# Patient Record
Sex: Male | Born: 1975 | Race: White | Hispanic: No | Marital: Single | Smoking: Current every day smoker
Health system: Southern US, Community
[De-identification: ages and names within clinical notes are randomized; demographics above are authoritative.]

## PROBLEM LIST (undated history)

## (undated) DIAGNOSIS — N200 Calculus of kidney: Secondary | ICD-10-CM

---

## 2020-03-23 ENCOUNTER — Emergency Department (HOSPITAL_BASED_OUTPATIENT_CLINIC_OR_DEPARTMENT_OTHER)
Admission: EM | Admit: 2020-03-23 | Discharge: 2020-03-23 | Disposition: A | Discharge status: Home / Self Care | Payer: Self-pay | Attending: Emergency Medicine | Admitting: Emergency Medicine

## 2020-03-23 ENCOUNTER — Other Ambulatory Visit: Payer: Self-pay

## 2020-03-23 ENCOUNTER — Emergency Department (HOSPITAL_BASED_OUTPATIENT_CLINIC_OR_DEPARTMENT_OTHER): Payer: Self-pay

## 2020-03-23 ENCOUNTER — Encounter (HOSPITAL_BASED_OUTPATIENT_CLINIC_OR_DEPARTMENT_OTHER): Payer: Self-pay

## 2020-03-23 DIAGNOSIS — N201 Calculus of ureter: Secondary | ICD-10-CM

## 2020-03-23 DIAGNOSIS — N132 Hydronephrosis with renal and ureteral calculous obstruction: Secondary | ICD-10-CM | POA: Insufficient documentation

## 2020-03-23 DIAGNOSIS — F17210 Nicotine dependence, cigarettes, uncomplicated: Secondary | ICD-10-CM | POA: Insufficient documentation

## 2020-03-23 DIAGNOSIS — N179 Acute kidney failure, unspecified: Secondary | ICD-10-CM | POA: Insufficient documentation

## 2020-03-23 DIAGNOSIS — R61 Generalized hyperhidrosis: Secondary | ICD-10-CM | POA: Insufficient documentation

## 2020-03-23 DIAGNOSIS — M549 Dorsalgia, unspecified: Secondary | ICD-10-CM | POA: Insufficient documentation

## 2020-03-23 LAB — URINALYSIS, MICROSCOPIC (REFLEX)

## 2020-03-23 LAB — CBC WITH DIFFERENTIAL/PLATELET
Abs Immature Granulocytes: 0.07 10*3/uL (ref 0.00–0.07)
Basophils Absolute: 0.1 10*3/uL (ref 0.0–0.1)
Basophils Relative: 0 %
Eosinophils Absolute: 0.2 10*3/uL (ref 0.0–0.5)
Eosinophils Relative: 2 %
HCT: 46.5 % (ref 39.0–52.0)
Hemoglobin: 15.4 g/dL (ref 13.0–17.0)
Immature Granulocytes: 1 %
Lymphocytes Relative: 22 %
Lymphs Abs: 2.9 10*3/uL (ref 0.7–4.0)
MCH: 30.1 pg (ref 26.0–34.0)
MCHC: 33.1 g/dL (ref 30.0–36.0)
MCV: 91 fL (ref 80.0–100.0)
Monocytes Absolute: 1 10*3/uL (ref 0.1–1.0)
Monocytes Relative: 7 %
Neutro Abs: 9.2 10*3/uL — ABNORMAL HIGH (ref 1.7–7.7)
Neutrophils Relative %: 68 %
Platelets: 194 10*3/uL (ref 150–400)
RBC: 5.11 MIL/uL (ref 4.22–5.81)
RDW: 12.2 % (ref 11.5–15.5)
WBC: 13.4 10*3/uL — ABNORMAL HIGH (ref 4.0–10.5)
nRBC: 0 % (ref 0.0–0.2)

## 2020-03-23 LAB — COMPREHENSIVE METABOLIC PANEL
ALT: 26 U/L (ref 0–44)
AST: 27 U/L (ref 15–41)
Albumin: 4.3 g/dL (ref 3.5–5.0)
Alkaline Phosphatase: 63 U/L (ref 38–126)
Anion gap: 9 (ref 5–15)
BUN: 17 mg/dL (ref 6–20)
CO2: 27 mmol/L (ref 22–32)
Calcium: 8.9 mg/dL (ref 8.9–10.3)
Chloride: 101 mmol/L (ref 98–111)
Creatinine, Ser: 1.44 mg/dL — ABNORMAL HIGH (ref 0.61–1.24)
GFR calc Af Amer: >60 mL/min (ref >60)
GFR calc non Af Amer: 59 mL/min — ABNORMAL LOW (ref >60)
Glucose, Bld: 98 mg/dL (ref 70–99)
Potassium: 3.9 mmol/L (ref 3.5–5.1)
Sodium: 137 mmol/L (ref 135–145)
Total Bilirubin: 0.6 mg/dL (ref 0.3–1.2)
Total Protein: 7.5 g/dL (ref 6.5–8.1)

## 2020-03-23 LAB — URINALYSIS, ROUTINE W REFLEX MICROSCOPIC
Bilirubin Urine: NEGATIVE
Glucose, UA: NEGATIVE mg/dL
Ketones, ur: NEGATIVE mg/dL
Nitrite: NEGATIVE
Protein, ur: 100 mg/dL — AB
Specific Gravity, Urine: 1.02 (ref 1.005–1.030)
pH: 7 (ref 5.0–8.0)

## 2020-03-23 MED ORDER — KETOROLAC TROMETHAMINE 30 MG/ML IJ SOLN
30.0000 mg | Freq: Once | INTRAMUSCULAR | Status: AC
  Administered 2020-03-23: 30 mg via INTRAVENOUS
  Filled 2020-03-23: qty 1

## 2020-03-23 MED ORDER — ONDANSETRON 4 MG PO TBDP: 4.0000 mg | ORAL_TABLET | Freq: Three times a day (TID) | ORAL | 0 refills | Status: DC | PRN

## 2020-03-23 MED ORDER — OXYCODONE-ACETAMINOPHEN 5-325 MG PO TABS: 1.0000 | ORAL_TABLET | Freq: Four times a day (QID) | ORAL | 0 refills | Status: DC | PRN

## 2020-03-23 MED ORDER — MORPHINE SULFATE (PF) 4 MG/ML IV SOLN
4.0000 mg | Freq: Once | INTRAVENOUS | Status: AC
  Administered 2020-03-23: 4 mg via INTRAVENOUS
  Filled 2020-03-23: qty 1

## 2020-03-23 MED ORDER — SODIUM CHLORIDE 0.9 % IV BOLUS
1000.0000 mL | Freq: Once | INTRAVENOUS | Status: AC
  Administered 2020-03-23: 1000 mL via INTRAVENOUS

## 2020-03-23 MED ORDER — TAMSULOSIN HCL 0.4 MG PO CAPS: 0.4000 mg | ORAL_CAPSULE | Freq: Every day | ORAL | 0 refills | Status: DC

## 2020-03-23 NOTE — ED Provider Notes (Signed)
[facility] EMERGENCY DEPARTMENT Provider Note   CSN: [id number] Arrival date & time: 03/23/20  6967     History Chief Complaint  Patient presents with  . Flank Pain    Steven Baxter is a 44 y.o. male.  He is here with a complaint of severe right flank pain that started last evening.  No trauma.  Associated with diaphoresis and nausea.  Some radiation to right lower quadrant.  Also having urinary frequency but no dysuria.  No hematuria.  No prior history of kidney stones.  Took some Aleve without any improvement.  Also took a first dose of a friend's antibiotic in case he had infection.  The history is provided by the patient.  Flank Pain This is a new problem. The current episode started yesterday. The problem occurs constantly. The problem has not changed since onset.Associated symptoms include abdominal pain. Pertinent negatives include no chest pain, no headaches and no shortness of breath. Nothing aggravates the symptoms. Nothing relieves the symptoms. He has tried nothing for the symptoms. The treatment provided no relief.       History reviewed. No pertinent past medical history.  There are no problems to display for this patient.   History reviewed. No pertinent surgical history.     No family history on file.  Social History   Tobacco Use  . Smoking status: Current Every Day Smoker    Packs/day: 1.00    Types: Cigarettes  . Smokeless tobacco: Never Used  Vaping Use  . Vaping Use: Never used  Substance Use Topics  . Alcohol use: Never  . Drug use: Never    Home Medications Prior to Admission medications   Not on File    Allergies    Patient has no known allergies.  Review of Systems   Review of Systems  Constitutional: Positive for diaphoresis.  HENT: Negative for sore throat.   Eyes: Negative for visual disturbance.  Respiratory: Negative for shortness of breath.   Cardiovascular: Negative for chest pain.  Gastrointestinal: Positive for  abdominal pain and nausea. Negative for vomiting.  Genitourinary: Positive for flank pain and frequency. Negative for dysuria and testicular pain.  Musculoskeletal: Positive for back pain.  Skin: Negative for rash.  Neurological: Negative for headaches.    Physical Exam Updated Vital Signs BP 118/80 (BP Location: Right Arm)   Pulse 74   Temp 97.8 F (36.6 C) (Oral)   Resp 16   Ht 5\' 9"  (1.753 m)   Wt 64 kg   SpO2 100%   BMI 20.84 kg/m   Physical Exam Vitals and nursing note reviewed.  Constitutional:      Appearance: Normal appearance. He is well-developed.  HENT:     Head: Normocephalic and atraumatic.  Eyes:     Conjunctiva/sclera: Conjunctivae normal.  Cardiovascular:     Rate and Rhythm: Normal rate and regular rhythm.     Heart sounds: No murmur heard.   Pulmonary:     Effort: Pulmonary effort is normal. No respiratory distress.     Breath sounds: Normal breath sounds.  Abdominal:     Palpations: Abdomen is soft.     Tenderness: There is no abdominal tenderness. There is right CVA tenderness. There is no guarding or rebound.  Musculoskeletal:        General: No deformity or signs of injury. Normal range of motion.     Cervical back: Neck supple.  Skin:    General: Skin is warm and dry.  Neurological:  General: No focal deficit present.     Mental Status: He is alert.     Gait: Gait normal.     ED Results / Procedures / Treatments   Labs (all labs ordered are listed, but only abnormal results are displayed) Labs Reviewed  URINALYSIS, ROUTINE W REFLEX MICROSCOPIC - Abnormal; Notable for the following components:      Result Value   Hgb urine dipstick MODERATE (*)    Protein, ur 100 (*)    Leukocytes,Ua TRACE (*)    All other components within normal limits  COMPREHENSIVE METABOLIC PANEL - Abnormal; Notable for the following components:   Creatinine, Ser 1.44 (*)    GFR calc non Af Amer 59 (*)    All other components within normal limits  CBC WITH  DIFFERENTIAL/PLATELET - Abnormal; Notable for the following components:   WBC 13.4 (*)    Neutro Abs 9.2 (*)    All other components within normal limits  URINALYSIS, MICROSCOPIC (REFLEX) - Abnormal; Notable for the following components:   Bacteria, UA FEW (*)    All other components within normal limits    EKG None  Radiology CT Renal Stone Study  Result Date: 03/23/2020 CLINICAL DATA:  Right flank pain EXAM: CT ABDOMEN AND PELVIS WITHOUT CONTRAST TECHNIQUE: Multidetector CT imaging of the abdomen and pelvis was performed following the standard protocol without oral or IV contrast. COMPARISON:  None. FINDINGS: Lower chest: There is a calcified granuloma in the right base. There is slight bibasilar atelectasis. Lung bases otherwise are clear. Hepatobiliary: No focal liver lesions are appreciable on this noncontrast enhanced study. The gallbladder wall is not appreciably thickened. There is no biliary duct dilatation. Pancreas: There is no pancreatic mass or inflammatory focus. Spleen: No splenic lesions are evident. Adrenals/Urinary Tract: Adrenals bilaterally appear normal. There is no evident renal mass on either side. There is moderate hydronephrosis on the right. There is no appreciable hydronephrosis on the left. There are multiple 1-2 mm calculi scattered throughout each kidney. On the right, there is a 2 x 2 mm calculus in the distal ureter immediately proximal to the right ureterovesical junction. There is a nearby phlebolith on the right which appear separate from the right ureter. There are several phleboliths on the left. Urinary bladder is midline. The thickness of the urinary bladder wall is within normal limits. Stomach/Bowel: There is fluid in multiple loops of small bowel without small bowel wall thickening. There is moderate stool in colon. There is no appreciable bowel obstruction. Note that stomach is filled with food material. The terminal ileum appears unremarkable. There is no  appreciable free air or portal venous air. Vascular/Lymphatic: No abdominal aortic aneurysm. No vascular lesions are evident on this noncontrast enhanced study. There is no appreciable adenopathy in the abdomen or pelvis. Reproductive: Prostate and seminal vesicles are normal in size and contour. No evident pelvic mass. Other: Appendix appears unremarkable. No abscess or ascites is appreciable in the abdomen or pelvis. Musculoskeletal: No blastic or lytic bone lesions. No intramuscular or abdominal wall lesions are evident. IMPRESSION: 1. 2 x 2 mm calculus distal right ureter near the ureterovesical junction. Moderate hydronephrosis noted on the right. 2.  Multiple 1-2 mm calculi scattered throughout each kidney. 3. Multiple loops of fluid-filled small bowel which potentially could indicate a degree of underlying enteritis. No bowel obstruction or bowel wall thickening. No abscess in the abdomen or pelvis. Appendix appears normal. Stomach filled with food material. Electronically Signed   By: Bretta Bang  III M.D.   On: 03/23/2020 12:06    Procedures Procedures (including critical care time)  Medications Ordered in ED Medications  ketorolac (TORADOL) 30 MG/ML injection 30 mg (has no administration in time range)  sodium chloride 0.9 % bolus 1,000 mL (has no administration in time range)  morphine 4 MG/ML injection 4 mg (has no administration in time range)    ED Course  I have reviewed the triage vital signs and the nursing notes.  Pertinent labs & imaging results that were available during my care of the patient were reviewed by me and considered in my medical decision making (see chart for details).    MDM Rules/Calculators/A&P                         This patient complains of right flank pain and urinary frequency this involves an extensive number of treatment Options and is a complaint that carries with it a high risk of complications and Morbidity. The differential includes kidney  stone, pyelonephritis, biliary colic, musculoskeletal  I ordered, reviewed and interpreted labs, which included CBC with elevated white count normal hemoglobin, normal chemistries other than elevated creatinine unknown baseline, urinalysis with 6-10 reds 0-5 whites no definite infection I ordered medication IV fluids pain medication and nausea medication I ordered imaging studies which included CT renal and I independently    visualized and interpreted imaging which showed 2 mm distal right ureteral stone Previous records obtained and reviewed in epic, no recent visits  After the interventions stated above, I reevaluated the patient and found patient's pain to be well controlled.  Discussed the natural course of kidney stones and he is comfortable with plan for outpatient management with clear return instructions given.   Final Clinical Impression(s) / ED Diagnoses Final diagnoses:  Ureterolithiasis  AKI (acute kidney injury) (HCC)    Rx / DC Orders ED Discharge Orders         Ordered    oxyCODONE-acetaminophen (PERCOCET/ROXICET) 5-325 MG tablet  Every 6 hours PRN        03/23/20 1256    ondansetron (ZOFRAN-ODT) 4 MG disintegrating tablet  Every 8 hours PRN        03/23/20 1256    tamsulosin (FLOMAX) 0.4 MG CAPS capsule  Daily        03/23/20 1256           Terrilee Files, MD 03/23/20 1824

## 2020-03-23 NOTE — ED Notes (Signed)
ED Provider at bedside. 

## 2020-03-23 NOTE — ED Triage Notes (Signed)
Pt arrives with c/o right sided flank pain starting yesterday, reports some nausea, fatigue, and frequent urination.

## 2020-03-23 NOTE — Discharge Instructions (Addendum)
You were seen in the emergency department for right flank pain and urinary frequency.  You had blood work and a urinalysis along with a CAT scan of your abdomen and pelvis.  You have a 2 mm stone that is close to the bladder.  This will likely pass on its own.  We are prescribing you some pain and nausea medication.  If you experience any fever or uncontrolled pain return to the emergency department.

## 2021-01-26 ENCOUNTER — Emergency Department (HOSPITAL_BASED_OUTPATIENT_CLINIC_OR_DEPARTMENT_OTHER)
Admission: EM | Admit: 2021-01-26 | Discharge: 2021-01-27 | Disposition: A | Discharge status: Home / Self Care | Payer: Self-pay | Attending: Emergency Medicine | Admitting: Emergency Medicine

## 2021-01-26 ENCOUNTER — Encounter (HOSPITAL_BASED_OUTPATIENT_CLINIC_OR_DEPARTMENT_OTHER): Payer: Self-pay | Admitting: *Deleted

## 2021-01-26 ENCOUNTER — Other Ambulatory Visit: Payer: Self-pay

## 2021-01-26 DIAGNOSIS — N201 Calculus of ureter: Secondary | ICD-10-CM | POA: Insufficient documentation

## 2021-01-26 DIAGNOSIS — N23 Unspecified renal colic: Secondary | ICD-10-CM | POA: Insufficient documentation

## 2021-01-26 DIAGNOSIS — F17210 Nicotine dependence, cigarettes, uncomplicated: Secondary | ICD-10-CM | POA: Insufficient documentation

## 2021-01-26 DIAGNOSIS — N289 Disorder of kidney and ureter, unspecified: Secondary | ICD-10-CM | POA: Insufficient documentation

## 2021-01-26 HISTORY — DX: Calculus of kidney: N20.0

## 2021-01-26 MED ORDER — ONDANSETRON HCL 4 MG/2ML IJ SOLN
4.0000 mg | Freq: Once | INTRAMUSCULAR | Status: AC
  Administered 2021-01-27: 4 mg via INTRAVENOUS
  Filled 2021-01-26: qty 2

## 2021-01-26 MED ORDER — KETOROLAC TROMETHAMINE 30 MG/ML IJ SOLN
30.0000 mg | Freq: Once | INTRAMUSCULAR | Status: AC
  Administered 2021-01-27: 30 mg via INTRAVENOUS
  Filled 2021-01-26: qty 1

## 2021-01-26 MED ORDER — LACTATED RINGERS IV BOLUS
1000.0000 mL | Freq: Once | INTRAVENOUS | Status: AC
  Administered 2021-01-27: 1000 mL via INTRAVENOUS

## 2021-01-26 MED ORDER — MORPHINE SULFATE (PF) 4 MG/ML IV SOLN
4.0000 mg | Freq: Once | INTRAVENOUS | Status: AC
  Administered 2021-01-27: 4 mg via INTRAVENOUS
  Filled 2021-01-26: qty 1

## 2021-01-26 NOTE — ED Triage Notes (Signed)
C/o flank pain radiates around to abd x 3 hrs. Hx stones

## 2021-01-26 NOTE — ED Provider Notes (Signed)
[facility] EMERGENCY DEPARTMENT Provider Note   CSN: [id number] Arrival date & time: 01/26/21  2313     History Chief Complaint  Patient presents with   Flank Pain    Steven Baxter is a 45 y.o. male.  The history is provided by the patient.  Flank Pain He has history of kidney stones and comes in complaining of left flank pain which started at about 6 PM.  Pain got significantly worse at about 9 PM.  Pain is now severe and he rates it a 10/10.  There is associated nausea but no vomiting.  Pain does radiate to the left lower abdomen.  He denies fever or chills but has broken out in a sweat.  Nothing makes the pain better, nothing makes it worse.  He has been unable to find a comfortable position.  He states that this is hurting worse than his previous kidney stone.   Past Medical History:  Diagnosis Date   Kidney stones     There are no problems to display for this patient.   History reviewed. No pertinent surgical history.     No family history on file.  Social History   Tobacco Use   Smoking status: Every Day    Packs/day: 1.00    Types: Cigarettes   Smokeless tobacco: Never  Vaping Use   Vaping Use: Never used  Substance Use Topics   Alcohol use: Never   Drug use: Never    Home Medications Prior to Admission medications   Medication Sig Start Date End Date Taking? Authorizing Provider  ondansetron (ZOFRAN-ODT) 4 MG disintegrating tablet Take 1 tablet (4 mg total) by mouth every 8 (eight) hours as needed for nausea or vomiting. 03/23/20   Terrilee Files, MD  oxyCODONE-acetaminophen (PERCOCET/ROXICET) 5-325 MG tablet Take 1-2 tablets by mouth every 6 (six) hours as needed for severe pain. 03/23/20   Terrilee Files, MD  tamsulosin (FLOMAX) 0.4 MG CAPS capsule Take 1 capsule (0.4 mg total) by mouth daily. 03/23/20   Terrilee Files, MD    Allergies    Patient has no known allergies.  Review of Systems   Review of Systems  Genitourinary:  Positive  for flank pain.  All other systems reviewed and are negative.  Physical Exam Updated Vital Signs BP 123/87 (BP Location: Right Arm)   Pulse 73   Temp 97.6 F (36.4 C)   Resp 16   Ht 5\' 8"  (1.727 m)   Wt 63.5 kg   SpO2 100%   BMI 21.29 kg/m   Physical Exam Vitals and nursing note reviewed.  45 year old male, moaning in pain, but in no acute distress. Vital signs are normal. Oxygen saturation is 100%, which is normal. Head is normocephalic and atraumatic. PERRLA, EOMI. Oropharynx is clear. Neck is nontender and supple without adenopathy or JVD. Back is nontender in the midline.  There is moderate to severe left CVA tenderness. Lungs are clear without rales, wheezes, or rhonchi. Chest is nontender. Heart has regular rate and rhythm without murmur. Abdomen is soft, flat, with mild to moderate left lower quadrant tenderness.  There is no rebound or guarding.  There are no masses or hepatosplenomegaly and peristalsis is hypoactive. Extremities have no cyanosis or edema, full range of motion is present. Skin is warm and dry without rash. Neurologic: Mental status is normal, cranial nerves are intact, there are no motor or sensory deficits.  ED Results / Procedures / Treatments   Labs (all labs  ordered are listed, but only abnormal results are displayed) Labs Reviewed  BASIC METABOLIC PANEL - Abnormal; Notable for the following components:      Result Value   CO2 21 (*)    Creatinine, Ser 1.38 (*)    All other components within normal limits  CBC WITH DIFFERENTIAL/PLATELET - Abnormal; Notable for the following components:   WBC 12.7 (*)    Monocytes Absolute 1.1 (*)    All other components within normal limits  URINALYSIS, ROUTINE W REFLEX MICROSCOPIC - Abnormal; Notable for the following components:   Hgb urine dipstick MODERATE (*)    Protein, ur 30 (*)    Leukocytes,Ua TRACE (*)    All other components within normal limits  URINALYSIS, MICROSCOPIC (REFLEX) - Abnormal;  Notable for the following components:   Bacteria, UA RARE (*)    All other components within normal limits    Radiology CT Renal Stone Study  Result Date: 01/27/2021 CLINICAL DATA:  45 year old male with flank pain. Concern for kidney stone. EXAM: CT ABDOMEN AND PELVIS WITHOUT CONTRAST TECHNIQUE: Multidetector CT imaging of the abdomen and pelvis was performed following the standard protocol without IV contrast. COMPARISON:  CT dated 03/23/2020. FINDINGS: Evaluation of this exam is limited in the absence of intravenous contrast. Lower chest: The visualized lung bases are clear. Small right lung base calcified granuloma. No intra-abdominal free air or free fluid. Hepatobiliary: No focal liver abnormality is seen. No gallstones, gallbladder wall thickening, or biliary dilatation. Pancreas: Unremarkable. No pancreatic ductal dilatation or surrounding inflammatory changes. Spleen: Normal in size without focal abnormality. Adrenals/Urinary Tract: The adrenal glands unremarkable. There is a 4 mm stone in the distal left ureter with mild left hydronephrosis. Multiple additional nonobstructing bilateral renal calculi measure up to 4 mm in the inferior pole of the right kidney. There is no hydronephrosis on the right. The urinary bladder is collapsed. Stomach/Bowel: There is no bowel obstruction or active inflammation. The appendix is normal. Vascular/Lymphatic: The abdominal aorta and IVC are grossly unremarkable on this noncontrast CT. No portal venous gas. There is no adenopathy. Reproductive: The prostate and seminal vesicles are grossly unremarkable. No pelvic mass. Other: None Musculoskeletal: No acute or significant osseous findings. IMPRESSION: 1. A 4 mm distal left ureteral stone with mild left hydronephrosis. Multiple additional nonobstructing bilateral renal calculi. No hydronephrosis on the right. 2. No bowel obstruction. Normal appendix. Electronically Signed   By: Elgie Collard M.D.   On: 01/27/2021  00:53    Procedures Procedures   Medications Ordered in ED Medications  lactated ringers bolus 1,000 mL (has no administration in time range)  morphine 4 MG/ML injection 4 mg (has no administration in time range)  ketorolac (TORADOL) 30 MG/ML injection 30 mg (has no administration in time range)  ondansetron (ZOFRAN) injection 4 mg (has no administration in time range)    ED Course  I have reviewed the triage vital signs and the nursing notes.  Pertinent labs & imaging results that were available during my care of the patient were reviewed by me and considered in my medical decision making (see chart for details).   MDM Rules/Calculators/A&P                         Left flank pain suspicious for ureterolithiasis.  Possible pyelonephritis.  Doubt diverticulitis, abdominal aortic aneurysm.  Old records reviewed confirming prior ED visit for ureterolithiasis.  At that time, CT scan showed multiple 1-2 mm intrarenal calculi bilaterally.  We given IV fluids, morphine, ketorolac, ondansetron and will be sent for renal stone protocol CT scan.  CT scan shows 4 mm distal left ureteral calculus as well as intrarenal calculi bilaterally.  Labs show mild renal insufficiency which is not significantly changed from baseline.  He had good relief of pain with above-noted treatment.  He is discharged with prescription for hydrocodone-acetaminophen, ondansetron, and tamsulosin.  He is referred to urology for follow-up.  Return precautions discussed.  Final Clinical Impression(s) / ED Diagnoses Final diagnoses:  Ureterolithiasis  Ureteral colic  Renal insufficiency    Rx / DC Orders ED Discharge Orders          Ordered    tamsulosin (FLOMAX) 0.4 MG CAPS capsule  Daily        01/27/21 0559    HYDROcodone-acetaminophen (NORCO) 5-325 MG tablet  Every 4 hours PRN        01/27/21 0559    ondansetron (ZOFRAN) 4 MG tablet  Every 6 hours PRN        01/27/21 0559             Dione Booze,  MD 01/27/21 0602

## 2021-01-27 ENCOUNTER — Emergency Department (HOSPITAL_BASED_OUTPATIENT_CLINIC_OR_DEPARTMENT_OTHER): Payer: Self-pay

## 2021-01-27 LAB — URINALYSIS, MICROSCOPIC (REFLEX)

## 2021-01-27 LAB — CBC WITH DIFFERENTIAL/PLATELET
Abs Immature Granulocytes: 0.06 10*3/uL (ref 0.00–0.07)
Basophils Absolute: 0.1 10*3/uL (ref 0.0–0.1)
Basophils Relative: 1 %
Eosinophils Absolute: 0.3 10*3/uL (ref 0.0–0.5)
Eosinophils Relative: 2 %
HCT: 45.9 % (ref 39.0–52.0)
Hemoglobin: 16 g/dL (ref 13.0–17.0)
Immature Granulocytes: 1 %
Lymphocytes Relative: 29 %
Lymphs Abs: 3.7 10*3/uL (ref 0.7–4.0)
MCH: 30.5 pg (ref 26.0–34.0)
MCHC: 34.9 g/dL (ref 30.0–36.0)
MCV: 87.4 fL (ref 80.0–100.0)
Monocytes Absolute: 1.1 10*3/uL — ABNORMAL HIGH (ref 0.1–1.0)
Monocytes Relative: 9 %
Neutro Abs: 7.5 10*3/uL (ref 1.7–7.7)
Neutrophils Relative %: 58 %
Platelets: 191 10*3/uL (ref 150–400)
RBC: 5.25 MIL/uL (ref 4.22–5.81)
RDW: 12.2 % (ref 11.5–15.5)
WBC: 12.7 10*3/uL — ABNORMAL HIGH (ref 4.0–10.5)
nRBC: 0 % (ref 0.0–0.2)

## 2021-01-27 LAB — BASIC METABOLIC PANEL
Anion gap: 10 (ref 5–15)
BUN: 12 mg/dL (ref 6–20)
CO2: 21 mmol/L — ABNORMAL LOW (ref 22–32)
Calcium: 9.4 mg/dL (ref 8.9–10.3)
Chloride: 106 mmol/L (ref 98–111)
Creatinine, Ser: 1.38 mg/dL — ABNORMAL HIGH (ref 0.61–1.24)
GFR, Estimated: >60 mL/min (ref >60)
Glucose, Bld: 93 mg/dL (ref 70–99)
Potassium: 4.3 mmol/L (ref 3.5–5.1)
Sodium: 137 mmol/L (ref 135–145)

## 2021-01-27 LAB — URINALYSIS, ROUTINE W REFLEX MICROSCOPIC
Bilirubin Urine: NEGATIVE
Glucose, UA: NEGATIVE mg/dL
Ketones, ur: NEGATIVE mg/dL
Nitrite: NEGATIVE
Protein, ur: 30 mg/dL — AB
Specific Gravity, Urine: 1.015 (ref 1.005–1.030)
pH: 7 (ref 5.0–8.0)

## 2021-01-27 MED ORDER — HYDROCODONE-ACETAMINOPHEN 5-325 MG PO TABS
1.0000 | ORAL_TABLET | ORAL | 0 refills | Status: AC | PRN
Start: 2021-01-27 — End: ?

## 2021-01-27 MED ORDER — ONDANSETRON HCL 4 MG PO TABS: 4.0000 mg | ORAL_TABLET | Freq: Four times a day (QID) | ORAL | 0 refills | Status: AC | PRN

## 2021-01-27 MED ORDER — TAMSULOSIN HCL 0.4 MG PO CAPS: 0.4000 mg | ORAL_CAPSULE | Freq: Every day | ORAL | 0 refills | Status: AC

## 2021-01-27 NOTE — Discharge Instructions (Addendum)
Return if pain is not being adequately controlled, or if you start running a fever. 

## 2022-05-28 IMAGING — CT CT RENAL STONE PROTOCOL
2 of 4 series · 16 of 46 positions shown, 18 images · non-contrast
Comparison: CT dated 03/23/2020.

CLINICAL DATA: 44-year-old male with flank pain. Concern for kidney
stone.

EXAM:
CT ABDOMEN AND PELVIS WITHOUT CONTRAST
TECHNIQUE: Multidetector CT imaging of the abdomen and pelvis was performed
following the standard protocol without IV contrast.

[Series 2: axial st · axial · 0.74mm/px · z∈[+738,+1128]mm · 13 of 86 slices shown, 15 images]
[im 4/86  soft-tissue]
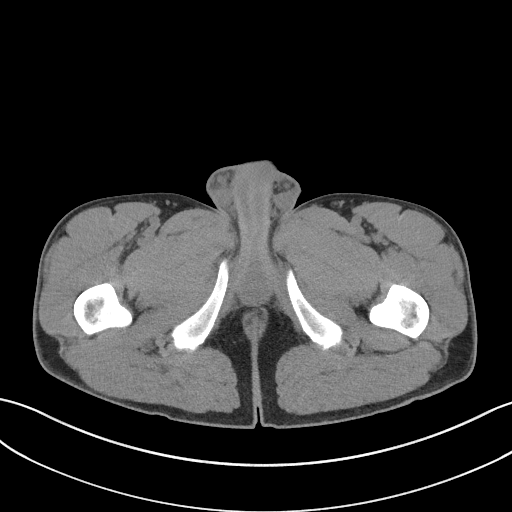
[im 4/86  bone]
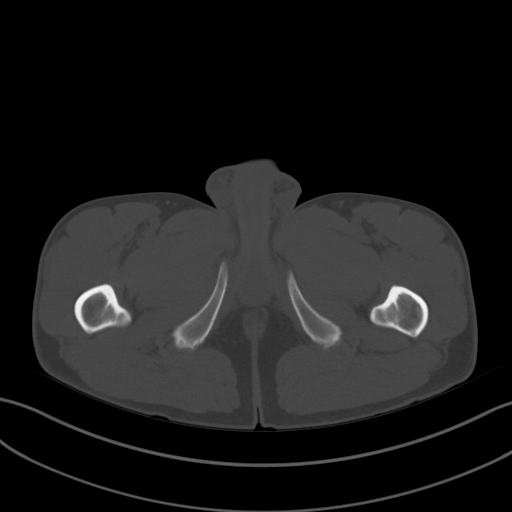
[im 10/86  soft-tissue]
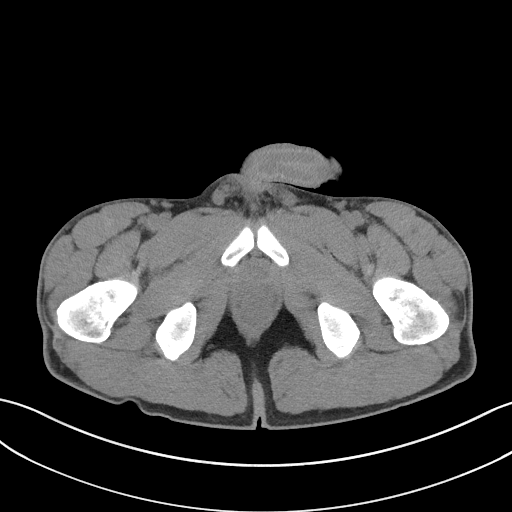
[im 17/86  soft-tissue]
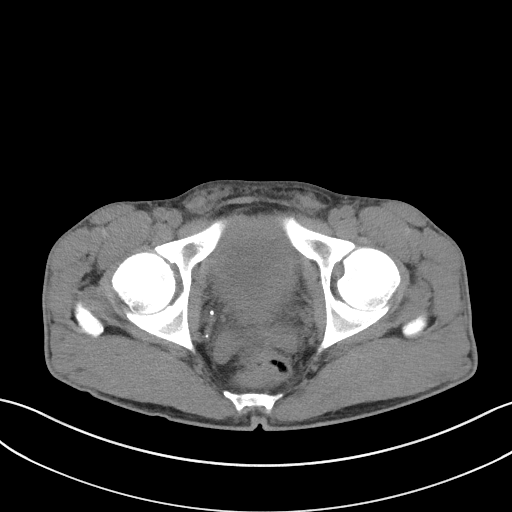
[im 23/86  soft-tissue]
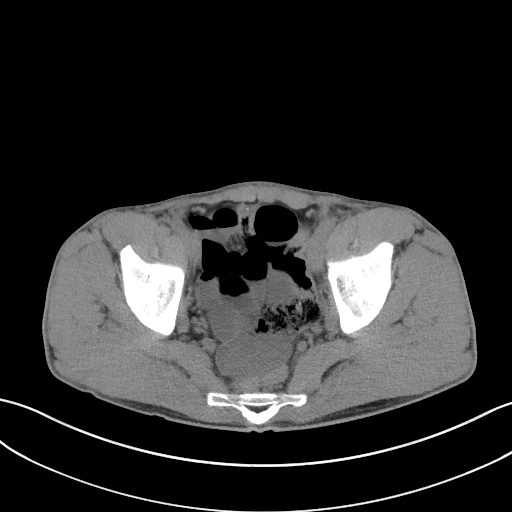
[im 30/86  soft-tissue]
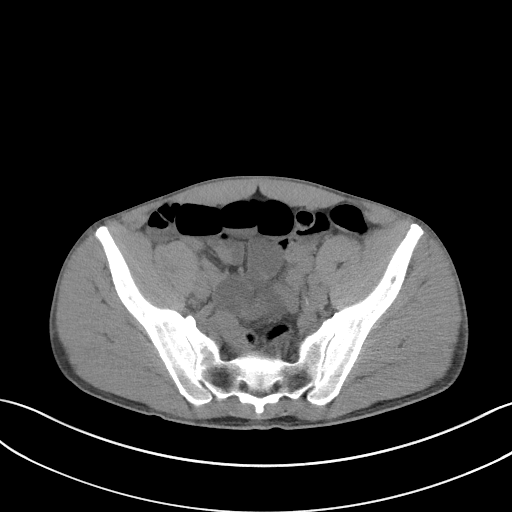
[im 36/86  soft-tissue]
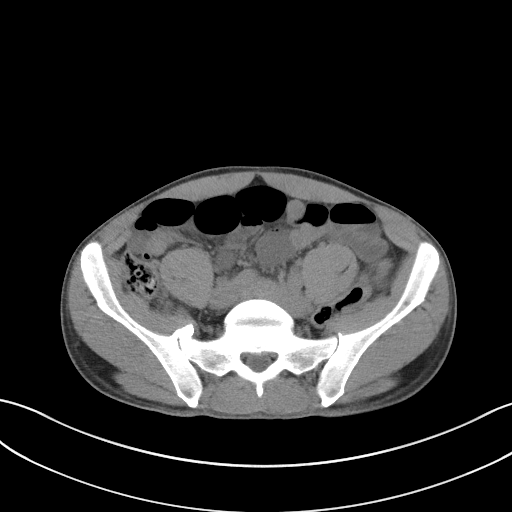
[im 43/86  soft-tissue]
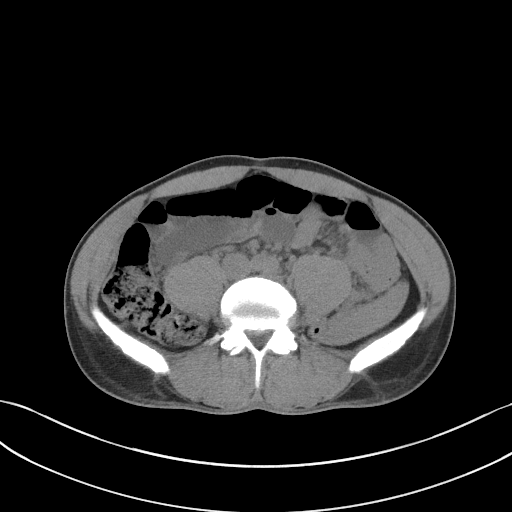
[im 50/86  soft-tissue]
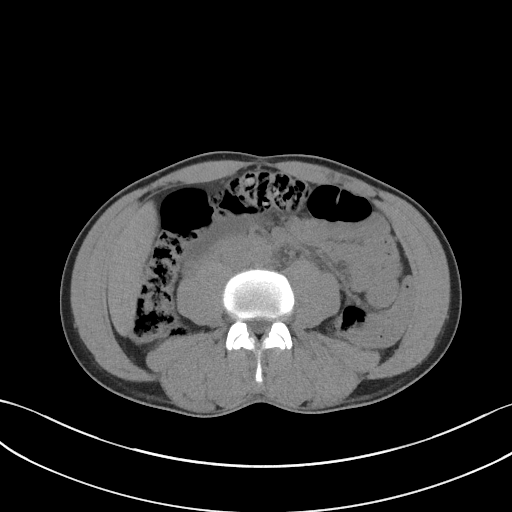
[im 56/86  soft-tissue]
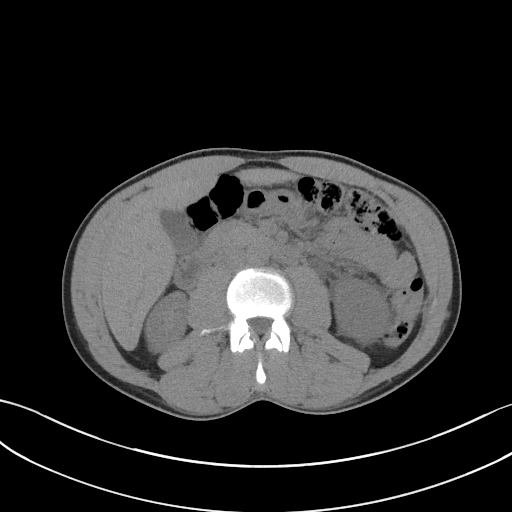
[im 56/86  bone]
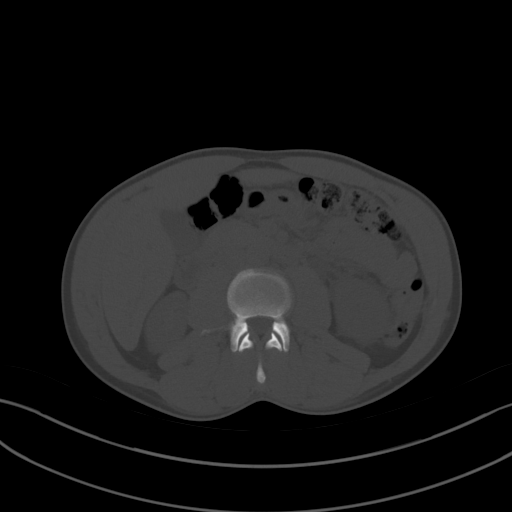
[im 63/86  soft-tissue]
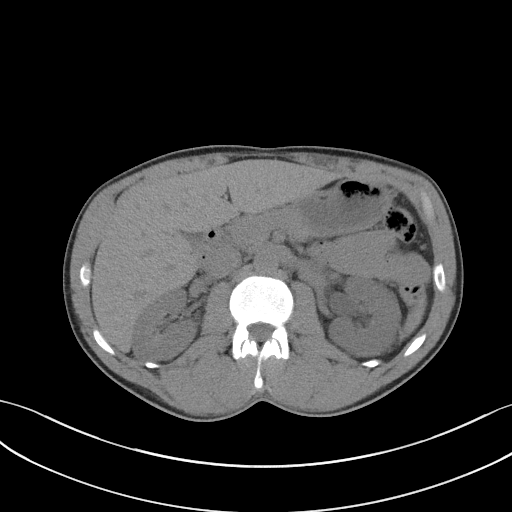
[im 69/86  soft-tissue]
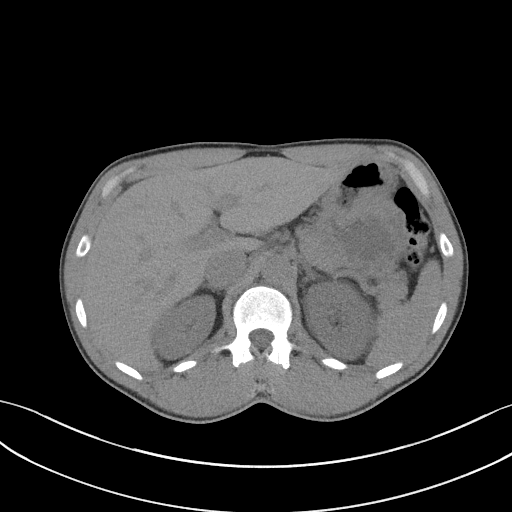
[im 76/86  soft-tissue]
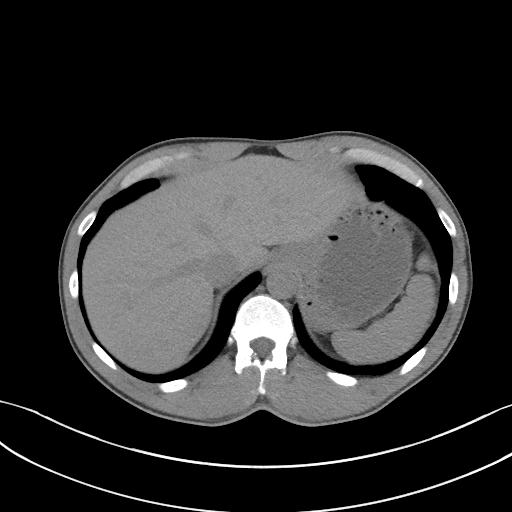
[im 82/86  soft-tissue]
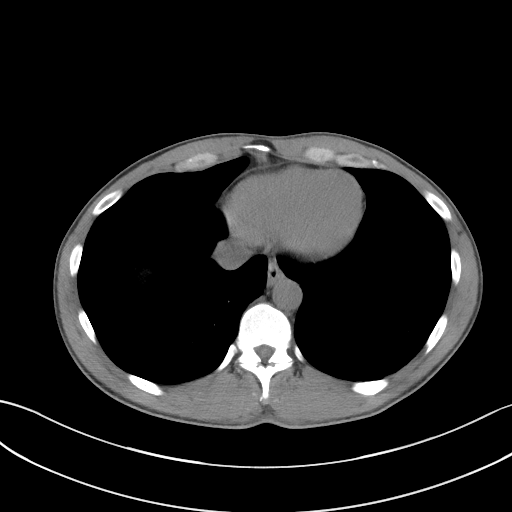

[Series 5: coronal st · coronal · 0.74mm/px · 3 of 74 slices shown]
[im 25/74  soft-tissue]
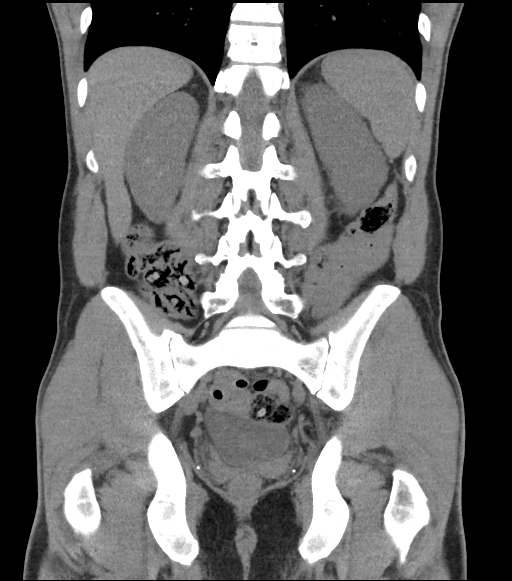
[im 33/74  soft-tissue]
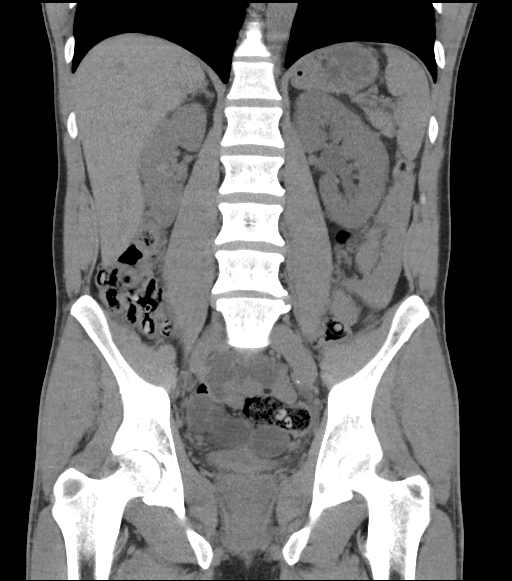
[im 41/74  soft-tissue]
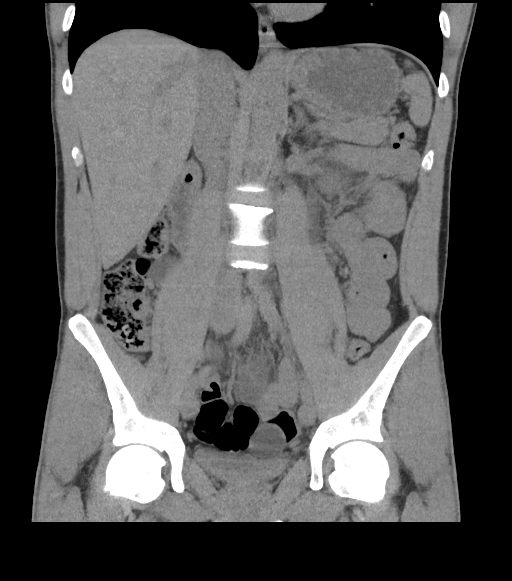

[16 of 46 positions shown; findings below may reference images not displayed]

FINDINGS: Evaluation of this exam is limited in the absence of intravenous
contrast.

Lower chest: The visualized lung bases are clear. Small right lung
base calcified granuloma.

No intra-abdominal free air or free fluid.

Hepatobiliary: No focal liver abnormality is seen. No gallstones,
gallbladder wall thickening, or biliary dilatation.

Pancreas: Unremarkable. No pancreatic ductal dilatation or
surrounding inflammatory changes.

Spleen: Normal in size without focal abnormality.

Adrenals/Urinary Tract: The adrenal glands unremarkable. There is a
4 mm stone in the distal left ureter with mild left hydronephrosis.
Multiple additional nonobstructing bilateral renal calculi measure
up to 4 mm in the inferior pole of the right kidney. There is no
hydronephrosis on the right. The urinary bladder is collapsed.

Stomach/Bowel: There is no bowel obstruction or active inflammation.
The appendix is normal.

Vascular/Lymphatic: The abdominal aorta and IVC are grossly
unremarkable on this noncontrast CT. No portal venous gas. There is
no adenopathy.

Reproductive: The prostate and seminal vesicles are grossly
unremarkable. No pelvic mass.

Other: None

Musculoskeletal: No acute or significant osseous findings.
IMPRESSION: 1. A 4 mm distal left ureteral stone with mild left hydronephrosis.
Multiple additional nonobstructing bilateral renal calculi. No
hydronephrosis on the right.
2. No bowel obstruction. Normal appendix.

## 2022-07-20 ENCOUNTER — Other Ambulatory Visit: Payer: Self-pay

## 2022-07-20 ENCOUNTER — Emergency Department (HOSPITAL_BASED_OUTPATIENT_CLINIC_OR_DEPARTMENT_OTHER): Payer: Self-pay

## 2022-07-20 ENCOUNTER — Encounter (HOSPITAL_BASED_OUTPATIENT_CLINIC_OR_DEPARTMENT_OTHER): Payer: Self-pay | Admitting: Urology

## 2022-07-20 ENCOUNTER — Emergency Department (HOSPITAL_BASED_OUTPATIENT_CLINIC_OR_DEPARTMENT_OTHER)
Admission: EM | Admit: 2022-07-20 | Discharge: 2022-07-20 | Disposition: A | Discharge status: Home / Self Care | Payer: Self-pay | Attending: Emergency Medicine | Admitting: Emergency Medicine

## 2022-07-20 DIAGNOSIS — M25552 Pain in left hip: Secondary | ICD-10-CM | POA: Insufficient documentation

## 2022-07-20 MED ORDER — KETOROLAC TROMETHAMINE 15 MG/ML IJ SOLN
15.0000 mg | Freq: Once | INTRAMUSCULAR | Status: AC
  Administered 2022-07-20: 15 mg via INTRAMUSCULAR
  Filled 2022-07-20: qty 1

## 2022-07-20 MED ORDER — PREDNISONE 20 MG PO TABS: 40.0000 mg | ORAL_TABLET | Freq: Every day | ORAL | 0 refills | Status: AC

## 2022-07-20 MED ORDER — OXYCODONE HCL 5 MG PO TABS
10.0000 mg | ORAL_TABLET | Freq: Once | ORAL | Status: AC
  Administered 2022-07-20: 10 mg via ORAL
  Filled 2022-07-20: qty 2

## 2022-07-20 NOTE — ED Provider Notes (Signed)
[facility] Provider Note   CSN: [id number] Arrival date & time: 07/20/22  1318     History Chief Complaint  Patient presents with   Hip Pain    Steven Baxter is a 47 y.o. male patient who presents to the emergency department today for evaluation of left hip pain.  Pain has been ongoing for the last week.  He states that pain was constant for a week after raking some leaves doing repetitive movement and then it resolved but really the last 1 to 2 days came back significantly.  Pain radiates down the left leg and into the posterior left hip and to the left lower back.  He denies any fever, chills, focal weakness or numbness to the left hip.  He denies bowel or bladder incontinence.   Hip Pain       Home Medications Prior to Admission medications   Medication Sig Start Date End Date Taking? Authorizing Provider  predniSONE (DELTASONE) 20 MG tablet Take 2 tablets (40 mg total) by mouth daily. 07/20/22  Yes Raul Del, Azrielle Springsteen M, PA-C  HYDROcodone-acetaminophen (NORCO) 5-325 MG tablet Take 1 tablet by mouth every 4 (four) hours as needed for moderate pain. 4/92/01   Delora Fuel, MD  ondansetron (ZOFRAN) 4 MG tablet Take 1 tablet (4 mg total) by mouth every 6 (six) hours as needed for nausea. 0/07/12   Delora Fuel, MD  tamsulosin (FLOMAX) 0.4 MG CAPS capsule Take 1 capsule (0.4 mg total) by mouth daily. 1/97/58   Delora Fuel, MD      Allergies    Patient has no known allergies.    Review of Systems   Review of Systems  All other systems reviewed and are negative.   Physical Exam Updated Vital Signs BP 115/75 (BP Location: Right Arm)   Pulse (!) 110   Temp (!) 97.4 F (36.3 C) (Oral)   Resp 20   Ht 5\' 8"  (1.727 m)   Wt 63.5 kg   SpO2 99%   BMI 21.29 kg/m  Physical Exam Vitals and nursing note reviewed.  Constitutional:      Appearance: Normal appearance.  HENT:     Head: Normocephalic and atraumatic.  Eyes:     General:        Right eye:  No discharge.        Left eye: No discharge.     Conjunctiva/sclera: Conjunctivae normal.  Pulmonary:     Effort: Pulmonary effort is normal.  Musculoskeletal:     Comments: Left hip is nontender to palpation.  He does have some mild left paralumbar tenderness.  No midline lumbar tenderness.  Process 5 strength to the lower extremities.  Normal sensation to the lower extremities.  Skin:    General: Skin is warm and dry.     Findings: No rash.  Neurological:     General: No focal deficit present.     Mental Status: He is alert.  Psychiatric:        Mood and Affect: Mood normal.        Behavior: Behavior normal.     ED Results / Procedures / Treatments   Labs (all labs ordered are listed, but only abnormal results are displayed) Labs Reviewed - No data to display  EKG None  Radiology DG Hip Unilat W or Wo Pelvis 2-3 Views Left  Result Date: 07/20/2022 CLINICAL DATA:  Pain EXAM: DG HIP (WITH OR WITHOUT PELVIS) 3V LEFT COMPARISON:  None Available. FINDINGS: There is no evidence  of hip fracture or dislocation. Pelvic ring is intact. There is no evidence of arthropathy or other focal bone abnormality. IMPRESSION: Negative. Electronically Signed   By: Sammie Bench M.D.   On: 07/20/2022 14:13    Procedures Procedures    Medications Ordered in ED Medications  ketorolac (TORADOL) 15 MG/ML injection 15 mg (15 mg Intramuscular Given 07/20/22 1656)  oxyCODONE (Oxy IR/ROXICODONE) immediate release tablet 10 mg (10 mg Oral Given 07/20/22 1659)    ED Course/ Medical Decision Making/ A&P Clinical Course as of 07/20/22 1706  Thu Jul 20, 2022  1658 DG Hip Unilat W or Wo Pelvis 2-3 Views Left I personally interpreted this. [CF]    Clinical Course User Index [CF] Hendricks Limes, PA-C                           Medical Decision Making Quang Thorpe is a 47 y.o. male patient who presents to the emergency department today for further evaluation of left hip pain.  Seems to be related to  possible sciatica versus trochanteric bursitis.  Patient does have any specific point tenderness over the lateral hip but that is where he is complaining of pain.  Patient does appear uncomfortable with pain which is likely resulting in his tachycardia.  I will give the patient a shot of Toradol and oxycodone and plan to discharge him home with prednisone.  I will also give him a referral to orthopedics for possible cortisone injections.  Patient amenable to this plan.  Strict return precautions were discussed.  He is safe for discharge.  At this time I have a low suspicion for cauda equina, epidural abscess, fracture, or other emergent processes in the spine.   Risk Prescription drug management.    Final Clinical Impression(s) / ED Diagnoses Final diagnoses:  Left hip pain    Rx / DC Orders ED Discharge Orders          Ordered    predniSONE (DELTASONE) 20 MG tablet  Daily        07/20/22 1705              Myna Bright [place], [place] 07/20/22 1706    Lennice Sites, DO 07/20/22 1721

## 2022-07-20 NOTE — ED Notes (Signed)
Reviewed discharge instructions and follow up with pt.  Pt states understanding. Ambulatory for discharge

## 2022-07-20 NOTE — Discharge Instructions (Signed)
Please take prednisone as prescribed.  I would like you for you to continue taking 600 mg ibuprofen every 6 hours as needed for pain.  Please continue this tomorrow.  I have also given you follow-up with an orthopedist.  You can call and schedule an appointment.  Return to the emergency department for any worsening symptoms you might have.

## 2022-07-20 NOTE — ED Triage Notes (Signed)
Left hip pain x 1 week after raking leaves  States pain continuing to worsen and radiating down left leg Denies any other injury to hip  Pt ambulatory with limp to triage
